# Patient Record
Sex: Male | Born: 1995 | Smoking: Never smoker
Health system: Southern US, Community
[De-identification: ages and names within clinical notes are randomized; demographics above are authoritative.]

---

## 2020-07-18 ENCOUNTER — Emergency Department (HOSPITAL_COMMUNITY): Payer: Self-pay

## 2020-07-18 ENCOUNTER — Other Ambulatory Visit: Payer: Self-pay

## 2020-07-18 ENCOUNTER — Inpatient Hospital Stay (HOSPITAL_COMMUNITY): Payer: Self-pay

## 2020-07-18 ENCOUNTER — Inpatient Hospital Stay (HOSPITAL_COMMUNITY)
Admission: EM | Admit: 2020-07-18 | Discharge: 2020-07-21 | DRG: 964 | Disposition: A | Discharge status: Home / Self Care | Payer: Self-pay | Attending: General Surgery | Admitting: General Surgery

## 2020-07-18 DIAGNOSIS — Q6689 Other  specified congenital deformities of feet: Secondary | ICD-10-CM

## 2020-07-18 DIAGNOSIS — S41031A Puncture wound without foreign body of right shoulder, initial encounter: Principal | ICD-10-CM | POA: Diagnosis present

## 2020-07-18 DIAGNOSIS — W3400XA Accidental discharge from unspecified firearms or gun, initial encounter: Secondary | ICD-10-CM

## 2020-07-18 DIAGNOSIS — Z6841 Body Mass Index (BMI) 40.0 and over, adult: Secondary | ICD-10-CM

## 2020-07-18 DIAGNOSIS — S42141A Displaced fracture of glenoid cavity of scapula, right shoulder, initial encounter for closed fracture: Secondary | ICD-10-CM | POA: Diagnosis present

## 2020-07-18 DIAGNOSIS — S143XXA Injury of brachial plexus, initial encounter: Secondary | ICD-10-CM | POA: Diagnosis present

## 2020-07-18 DIAGNOSIS — T1490XA Injury, unspecified, initial encounter: Secondary | ICD-10-CM

## 2020-07-18 DIAGNOSIS — S27819A Unspecified injury of esophagus (thoracic part), initial encounter: Secondary | ICD-10-CM | POA: Diagnosis present

## 2020-07-18 DIAGNOSIS — S12290A Other displaced fracture of third cervical vertebra, initial encounter for closed fracture: Secondary | ICD-10-CM

## 2020-07-18 DIAGNOSIS — T17908S Unspecified foreign body in respiratory tract, part unspecified causing other injury, sequela: Secondary | ICD-10-CM

## 2020-07-18 DIAGNOSIS — S42101A Fracture of unspecified part of scapula, right shoulder, initial encounter for closed fracture: Secondary | ICD-10-CM | POA: Diagnosis present

## 2020-07-18 DIAGNOSIS — Z0389 Encounter for observation for other suspected diseases and conditions ruled out: Secondary | ICD-10-CM

## 2020-07-18 DIAGNOSIS — Z20822 Contact with and (suspected) exposure to covid-19: Secondary | ICD-10-CM | POA: Diagnosis present

## 2020-07-18 DIAGNOSIS — G8911 Acute pain due to trauma: Secondary | ICD-10-CM | POA: Diagnosis present

## 2020-07-18 LAB — URINALYSIS, ROUTINE W REFLEX MICROSCOPIC
Bilirubin Urine: NEGATIVE
Glucose, UA: NEGATIVE mg/dL
Hgb urine dipstick: NEGATIVE
Ketones, ur: NEGATIVE mg/dL
Leukocytes,Ua: NEGATIVE
Nitrite: NEGATIVE
Protein, ur: NEGATIVE mg/dL
Specific Gravity, Urine: >1.046 — ABNORMAL HIGH (ref 1.005–1.030)
pH: 6 (ref 5.0–8.0)

## 2020-07-18 LAB — I-STAT CHEM 8, ED
BUN: 10 mg/dL (ref 6–20)
Calcium, Ion: 1.06 mmol/L — ABNORMAL LOW (ref 1.15–1.40)
Chloride: 104 mmol/L (ref 98–111)
Creatinine, Ser: 0.7 mg/dL (ref 0.61–1.24)
Glucose, Bld: 135 mg/dL — ABNORMAL HIGH (ref 70–99)
HCT: 44 % (ref 39.0–52.0)
Hemoglobin: 15 g/dL (ref 13.0–17.0)
Potassium: 3.2 mmol/L — ABNORMAL LOW (ref 3.5–5.1)
Sodium: 142 mmol/L (ref 135–145)
TCO2: 21 mmol/L — ABNORMAL LOW (ref 22–32)

## 2020-07-18 LAB — RESPIRATORY PANEL BY RT PCR (FLU A&B, COVID)
Influenza A by PCR: NEGATIVE
Influenza B by PCR: NEGATIVE
SARS Coronavirus 2 by RT PCR: NEGATIVE

## 2020-07-18 LAB — COMPREHENSIVE METABOLIC PANEL
ALT: 22 U/L (ref 0–44)
AST: 22 U/L (ref 15–41)
Albumin: 3.9 g/dL (ref 3.5–5.0)
Alkaline Phosphatase: 73 U/L (ref 38–126)
Anion gap: 12 (ref 5–15)
BUN: 8 mg/dL (ref 6–20)
CO2: 23 mmol/L (ref 22–32)
Calcium: 9.2 mg/dL (ref 8.9–10.3)
Chloride: 103 mmol/L (ref 98–111)
Creatinine, Ser: 0.86 mg/dL (ref 0.61–1.24)
GFR, Estimated: >60 mL/min (ref >60)
Glucose, Bld: 137 mg/dL — ABNORMAL HIGH (ref 70–99)
Potassium: 3.3 mmol/L — ABNORMAL LOW (ref 3.5–5.1)
Sodium: 138 mmol/L (ref 135–145)
Total Bilirubin: 1 mg/dL (ref 0.3–1.2)
Total Protein: 7.2 g/dL (ref 6.5–8.1)

## 2020-07-18 LAB — SAMPLE TO BLOOD BANK

## 2020-07-18 LAB — CBC
HCT: 44.4 % (ref 39.0–52.0)
Hemoglobin: 14.2 g/dL (ref 13.0–17.0)
MCH: 27.8 pg (ref 26.0–34.0)
MCHC: 32 g/dL (ref 30.0–36.0)
MCV: 87.1 fL (ref 80.0–100.0)
Platelets: 295 10*3/uL (ref 150–400)
RBC: 5.1 MIL/uL (ref 4.22–5.81)
RDW: 13.2 % (ref 11.5–15.5)
WBC: 15.9 10*3/uL — ABNORMAL HIGH (ref 4.0–10.5)
nRBC: 0 % (ref 0.0–0.2)

## 2020-07-18 LAB — PROTIME-INR
INR: 1.3 — ABNORMAL HIGH (ref 0.8–1.2)
Prothrombin Time: 15.3 seconds — ABNORMAL HIGH (ref 11.4–15.2)

## 2020-07-18 LAB — LACTIC ACID, PLASMA: Lactic Acid, Venous: 2.4 mmol/L (ref 0.5–1.9)

## 2020-07-18 MED ORDER — IOHEXOL 300 MG/ML  SOLN
75.0000 mL | Freq: Once | INTRAMUSCULAR | Status: AC | PRN
  Administered 2020-07-18: 75 mL via INTRAVENOUS

## 2020-07-18 MED ORDER — SODIUM CHLORIDE 0.9 % IV BOLUS
1000.0000 mL | Freq: Once | INTRAVENOUS | Status: AC
  Administered 2020-07-18: 1000 mL via INTRAVENOUS

## 2020-07-18 MED ORDER — FENTANYL CITRATE (PF) 100 MCG/2ML IJ SOLN
INTRAMUSCULAR | Status: AC
  Administered 2020-07-18: 50 ug
  Filled 2020-07-18: qty 2

## 2020-07-18 MED ORDER — MORPHINE SULFATE (PF) 4 MG/ML IV SOLN
4.0000 mg | Freq: Once | INTRAVENOUS | Status: AC
  Administered 2020-07-18: 4 mg via INTRAVENOUS
  Filled 2020-07-18: qty 1
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
## 2020-07-18 NOTE — ED Notes (Signed)
Admitting Provider at bedside. 

## 2020-07-18 NOTE — ED Notes (Signed)
Chaplain called per pt request, at bedside
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
## 2020-07-18 NOTE — ED Notes (Signed)
Patient transported to CT 

## 2020-07-18 NOTE — Progress Notes (Signed)
Orthopedic Tech Progress Note Patient Details:  Benjamin Jones 25-Feb-1996 [id number]  Ortho Devices Type of Ortho Device: Arm sling Ortho Device/Splint Location: rue Ortho Device/Splint Interventions: Ordered, Application, Adjustment   Post Interventions Patient Tolerated: Well Instructions Provided: Care of device, Adjustment of device   [name] Post 07/18/2020, 11:43 PM
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
## 2020-07-19 ENCOUNTER — Encounter (HOSPITAL_COMMUNITY): Payer: Self-pay

## 2020-07-19 ENCOUNTER — Inpatient Hospital Stay (HOSPITAL_COMMUNITY): Payer: Self-pay

## 2020-07-19 LAB — BASIC METABOLIC PANEL
Anion gap: 10 (ref 5–15)
BUN: 6 mg/dL (ref 6–20)
CO2: 25 mmol/L (ref 22–32)
Calcium: 9.1 mg/dL (ref 8.9–10.3)
Chloride: 104 mmol/L (ref 98–111)
Creatinine, Ser: 0.72 mg/dL (ref 0.61–1.24)
GFR, Estimated: >60 mL/min (ref >60)
Glucose, Bld: 106 mg/dL — ABNORMAL HIGH (ref 70–99)
Potassium: 3.6 mmol/L (ref 3.5–5.1)
Sodium: 139 mmol/L (ref 135–145)

## 2020-07-19 LAB — CBC
HCT: 43.9 % (ref 39.0–52.0)
Hemoglobin: 14.2 g/dL (ref 13.0–17.0)
MCH: 28 pg (ref 26.0–34.0)
MCHC: 32.3 g/dL (ref 30.0–36.0)
MCV: 86.4 fL (ref 80.0–100.0)
Platelets: 258 10*3/uL (ref 150–400)
RBC: 5.08 MIL/uL (ref 4.22–5.81)
RDW: 13.2 % (ref 11.5–15.5)
WBC: 16.5 10*3/uL — ABNORMAL HIGH (ref 4.0–10.5)
nRBC: 0 % (ref 0.0–0.2)

## 2020-07-19 LAB — HIV ANTIBODY (ROUTINE TESTING W REFLEX): HIV Screen 4th Generation wRfx: NONREACTIVE

## 2020-07-19 LAB — ETHANOL: Alcohol, Ethyl (B): <10 mg/dL (ref <10)

## 2020-07-19 MED ORDER — MORPHINE SULFATE (PF) 2 MG/ML IV SOLN
2.0000 mg | INTRAVENOUS | Status: DC | PRN
  Administered 2020-07-19 (×3): 4 mg via INTRAVENOUS
  Administered 2020-07-19 – 2020-07-21 (×5): 2 mg via INTRAVENOUS
  Filled 2020-07-19: qty 1
  Filled 2020-07-19 (×2): qty 2
  Filled 2020-07-19 (×3): qty 1
  Filled 2020-07-19: qty 2
  Filled 2020-07-19: qty 1

## 2020-07-19 MED ORDER — ACETAMINOPHEN 325 MG PO TABS: 650.0000 mg | ORAL_TABLET | ORAL | Status: DC | PRN

## 2020-07-19 MED ORDER — OXYCODONE HCL 5 MG PO TABS: 5.0000 mg | ORAL_TABLET | ORAL | Status: DC | PRN

## 2020-07-19 MED ORDER — ONDANSETRON HCL 4 MG/2ML IJ SOLN: 4.0000 mg | Freq: Four times a day (QID) | INTRAMUSCULAR | Status: DC | PRN

## 2020-07-19 MED ORDER — ENOXAPARIN SODIUM 30 MG/0.3ML ~~LOC~~ SOLN: 30.0000 mg | Freq: Two times a day (BID) | SUBCUTANEOUS | Status: DC

## 2020-07-19 MED ORDER — IOHEXOL 300 MG/ML  SOLN
150.0000 mL | Freq: Once | INTRAMUSCULAR | Status: AC | PRN
  Administered 2020-07-19: 100 mL via ORAL

## 2020-07-19 MED ORDER — METOPROLOL TARTRATE 5 MG/5ML IV SOLN: 5.0000 mg | Freq: Four times a day (QID) | INTRAVENOUS | Status: DC | PRN

## 2020-07-19 MED ORDER — SODIUM CHLORIDE 0.9 % IV SOLN
3.0000 g | Freq: Four times a day (QID) | INTRAVENOUS | Status: DC
  Administered 2020-07-19 – 2020-07-21 (×8): 3 g via INTRAVENOUS
  Filled 2020-07-19 (×4): qty 8
  Filled 2020-07-19: qty 3
  Filled 2020-07-19 (×4): qty 8
  Filled 2020-07-19: qty 3

## 2020-07-19 MED ORDER — ONDANSETRON 4 MG PO TBDP: 4.0000 mg | ORAL_TABLET | Freq: Four times a day (QID) | ORAL | Status: DC | PRN

## 2020-07-19 MED ORDER — ENOXAPARIN SODIUM 30 MG/0.3ML ~~LOC~~ SOLN
30.0000 mg | Freq: Two times a day (BID) | SUBCUTANEOUS | Status: DC
  Administered 2020-07-19 – 2020-07-21 (×5): 30 mg via SUBCUTANEOUS
  Filled 2020-07-19 (×5): qty 0.3

## 2020-07-19 MED ORDER — SODIUM CHLORIDE 0.9 % IV SOLN: INTRAVENOUS | Status: DC

## 2020-07-19 MED ORDER — OXYCODONE HCL 5 MG PO TABS
10.0000 mg | ORAL_TABLET | ORAL | Status: DC | PRN
  Administered 2020-07-19 – 2020-07-20 (×7): 10 mg via ORAL
  Filled 2020-07-19 (×7): qty 2
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
## 2020-07-19 NOTE — Progress Notes (Signed)
Pt's mother would live to be notified 2 hours before pt is being discharged so she an work out a ride for him to be picked up.

## 2020-07-19 NOTE — Progress Notes (Signed)
Orthopedic Tech Progress Note Patient Details:  Zacherie Honeyman Dec 09, 1995 [id number] Confirmed with RN that pt already has aspen cervical collar and shoulder sling applied. Patient ID: Benjamin Jones, male   DOB: Feb 10, 1996, 24 y.o.   MRN: [id number]   Gerald Stabs 07/19/2020, 4:53 PM

## 2020-07-20 ENCOUNTER — Encounter (HOSPITAL_COMMUNITY): Payer: Self-pay

## 2020-07-20 ENCOUNTER — Inpatient Hospital Stay (HOSPITAL_COMMUNITY): Payer: Self-pay | Admitting: Certified Registered"

## 2020-07-20 ENCOUNTER — Inpatient Hospital Stay (HOSPITAL_COMMUNITY): Payer: Self-pay

## 2020-07-20 ENCOUNTER — Encounter (HOSPITAL_COMMUNITY): Admission: EM | Disposition: A | Discharge status: Home / Self Care | Payer: Self-pay | Source: Home / Self Care

## 2020-07-20 HISTORY — PX: ESOPHAGOGASTRODUODENOSCOPY (EGD) WITH PROPOFOL: SHX5813

## 2020-07-20 LAB — CBC
HCT: 41 % (ref 39.0–52.0)
Hemoglobin: 13.3 g/dL (ref 13.0–17.0)
MCH: 27.9 pg (ref 26.0–34.0)
MCHC: 32.4 g/dL (ref 30.0–36.0)
MCV: 86.1 fL (ref 80.0–100.0)
Platelets: 239 10*3/uL (ref 150–400)
RBC: 4.76 MIL/uL (ref 4.22–5.81)
RDW: 13.2 % (ref 11.5–15.5)
WBC: 10.9 10*3/uL — ABNORMAL HIGH (ref 4.0–10.5)
nRBC: 0 % (ref 0.0–0.2)

## 2020-07-20 SURGERY — ESOPHAGOGASTRODUODENOSCOPY (EGD) WITH PROPOFOL
Anesthesia: Monitor Anesthesia Care

## 2020-07-20 MED ORDER — LACTATED RINGERS IV SOLN: INTRAVENOUS | Status: DC

## 2020-07-20 MED ORDER — PROPOFOL 500 MG/50ML IV EMUL
INTRAVENOUS | Status: DC | PRN
  Administered 2020-07-20: 150 ug/kg/min via INTRAVENOUS

## 2020-07-20 MED ORDER — LIDOCAINE 2% (20 MG/ML) 5 ML SYRINGE
INTRAMUSCULAR | Status: DC | PRN
  Administered 2020-07-20: 60 mg via INTRAVENOUS

## 2020-07-20 MED ORDER — LIDOCAINE 2% (20 MG/ML) 5 ML SYRINGE: INTRAMUSCULAR | Status: DC | PRN
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
## 2020-07-20 NOTE — TOC CAGE-AID Note (Signed)
Transition of Care [facility]) - CAGE-AID Screening   Patient Details  Name: Benjamin Jones MRN: [id number] Date of Birth: [birth year]  Transition of Care [facility]) CM/SW Contact:    Emeterio Reeve, [facility] Phone Number: 07/20/2020, 12:57 PM   Clinical Narrative:  CSW met with pt at bedside. CSW introduced self and explained role at the hospital.  Pt denies alcohol use. Pt occasional marijuana use. Pt reports he does not need resources at this time.    CAGE-AID Screening:    Have You Ever Felt You Ought to Cut Down on Your Drinking or Drug Use?: No Have People Annoyed You By Critizing Your Drinking Or Drug Use?: No Have You Felt Bad Or Guilty About Your Drinking Or Drug Use?: No Have You Ever Had a Drink or Used Drugs First Thing In The Morning to Steady Your Nerves or to Get Rid of a Hangover?: No CAGE-AID Score: 0  Substance Abuse Education Offered: Yes    Benjamin Jones, [place] Social Worker [phone]

## 2020-07-20 NOTE — Plan of Care (Signed)
°  Problem: Education: °Goal: Knowledge of General Education information will improve °Description: Including pain rating scale, medication(s)/side effects and non-pharmacologic comfort measures °Outcome: Progressing °  °Problem: Health Behavior/Discharge Planning: °Goal: Ability to manage health-related needs will improve °Outcome: Progressing °  °Problem: Clinical Measurements: °Goal: Will remain free from infection °Outcome: Progressing °  °Problem: Activity: °Goal: Risk for activity intolerance will decrease °Outcome: Progressing °  °Problem: Nutrition: °Goal: Adequate nutrition will be maintained °Outcome: Progressing °  °Problem: Coping: °Goal: Level of anxiety will decrease °Outcome: Progressing °  °

## 2020-07-20 NOTE — Anesthesia Postprocedure Evaluation (Signed)
Anesthesia Post Note  Patient: Benjamin Jones  Procedure(s) Performed: ESOPHAGOGASTRODUODENOSCOPY (EGD) WITH PROPOFOL (N/A )     Patient location during evaluation: PACU Anesthesia Type: MAC Level of consciousness: awake and alert Pain management: pain level controlled Vital Signs Assessment: post-procedure vital signs reviewed and stable Respiratory status: spontaneous breathing and respiratory function stable Cardiovascular status: stable Postop Assessment: no apparent nausea or vomiting Anesthetic complications: no   No complications documented.  Last Vitals:  Vitals:   07/20/20 1445 07/20/20 1455  BP: (!) 144/99 (!) 159/76  Pulse: 79 73  Resp: 16 15  Temp:    SpO2: 100% 98%    Last Pain:  Vitals:   07/20/20 1455  TempSrc:   PainSc: 8                  Candra R Izayah Miner
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
## 2020-07-20 NOTE — Anesthesia Preprocedure Evaluation (Addendum)
Anesthesia Evaluation  Patient identified by MRN, date of birth, ID band Patient awake    Reviewed: Allergy & Precautions, NPO status , Patient's Chart, lab work & pertinent test results  History of Anesthesia Complications Negative for: history of anesthetic complications  Airway Mallampati: II  TM Distance: >3 FB Neck ROM: Full    Dental no notable dental hx.    Pulmonary neg pulmonary ROS,    Pulmonary exam normal breath sounds clear to auscultation       Cardiovascular Exercise Tolerance: Good negative cardio ROS Normal cardiovascular exam Rhythm:Regular Rate:Normal     Neuro/Psych negative neurological ROS  negative psych ROS   GI/Hepatic negative GI ROS, Neg liver ROS,   Endo/Other  Morbid obesity  Renal/GU negative Renal ROS  negative genitourinary   Musculoskeletal negative musculoskeletal ROS (+)   Abdominal (+) + obese,   Peds negative pediatric ROS (+)  Hematology negative hematology ROS (+)   Anesthesia Other Findings   Reproductive/Obstetrics negative OB ROS                            Anesthesia Physical Anesthesia Plan  ASA: II  Anesthesia Plan: MAC   Post-op Pain Management:    Induction:   PONV Risk Score and Plan: 1 and Propofol infusion and TIVA  Airway Management Planned: Simple Face Mask, Nasal Cannula and Natural Airway  Additional Equipment:   Intra-op Plan:   Post-operative Plan:   Informed Consent: I have reviewed the patients History and Physical, chart, labs and discussed the procedure including the risks, benefits and alternatives for the proposed anesthesia with the patient or authorized representative who has indicated his/her understanding and acceptance.       Plan Discussed with: CRNA and Anesthesiologist  Anesthesia Plan Comments:         Anesthesia Quick Evaluation

## 2020-07-20 NOTE — Transfer of Care (Signed)
Immediate Anesthesia Transfer of Care Note  Patient: Benjamin Jones  Procedure(s) Performed: ESOPHAGOGASTRODUODENOSCOPY (EGD) WITH PROPOFOL (N/A )  Patient Location: Endoscopy Unit  Anesthesia Type:MAC  Level of Consciousness: awake, alert  and oriented  Airway & Oxygen Therapy: Patient Spontanous Breathing and Patient connected to nasal cannula oxygen  Post-op Assessment: Report given to RN and Post -op Vital signs reviewed and stable  Post vital signs: Reviewed and stable  Last Vitals:  Vitals Value Taken Time  BP 129/52 07/20/20 1435  Temp    Pulse 93 07/20/20 1436  Resp 22 07/20/20 1436  SpO2 100 % 07/20/20 1436  Vitals shown include unvalidated device data.  Last Pain:  Vitals:   07/20/20 1354  TempSrc: Temporal  PainSc: 8       Patients Stated Pain Goal: 2 (62/83/66 2947)  Complications: No complications documented.
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
## 2020-07-20 NOTE — Plan of Care (Signed)
  Problem: Education: Goal: Knowledge of General Education information will improve Description: Including pain rating scale, medication(s)/side effects and non-pharmacologic comfort measures Outcome: Progressing   Problem: Health Behavior/Discharge Planning: Goal: Ability to manage health-related needs will improve Outcome: Progressing   Problem: Clinical Measurements: Goal: Will remain free from infection Outcome: Progressing   
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
## 2020-07-21 ENCOUNTER — Other Ambulatory Visit (HOSPITAL_COMMUNITY): Payer: Self-pay | Admitting: General Surgery

## 2020-07-21 ENCOUNTER — Encounter (HOSPITAL_COMMUNITY): Payer: Self-pay | Admitting: Surgery

## 2020-07-21 ENCOUNTER — Inpatient Hospital Stay (HOSPITAL_COMMUNITY): Payer: Self-pay

## 2020-07-21 LAB — CBC
HCT: 39.2 % (ref 39.0–52.0)
Hemoglobin: 13.1 g/dL (ref 13.0–17.0)
MCH: 28.8 pg (ref 26.0–34.0)
MCHC: 33.4 g/dL (ref 30.0–36.0)
MCV: 86.2 fL (ref 80.0–100.0)
Platelets: 204 10*3/uL (ref 150–400)
RBC: 4.55 MIL/uL (ref 4.22–5.81)
RDW: 13 % (ref 11.5–15.5)
WBC: 11.2 10*3/uL — ABNORMAL HIGH (ref 4.0–10.5)
nRBC: 0 % (ref 0.0–0.2)

## 2020-07-21 MED ORDER — ACETAMINOPHEN 500 MG PO TABS: 1000.0000 mg | ORAL_TABLET | Freq: Four times a day (QID) | ORAL | Status: AC | PRN

## 2020-07-21 MED ORDER — GABAPENTIN 300 MG PO CAPS: 300.0000 mg | ORAL_CAPSULE | Freq: Two times a day (BID) | ORAL | 0 refills | Status: DC | PRN

## 2020-07-21 MED ORDER — OXYCODONE HCL 5 MG PO TABS: 5.0000 mg | ORAL_TABLET | ORAL | 0 refills | Status: DC | PRN

## 2020-07-21 MED ORDER — OXYCODONE HCL 5 MG PO TABS
5.0000 mg | ORAL_TABLET | ORAL | 0 refills | Status: AC | PRN
Start: 2020-07-21 — End: ?

## 2020-07-21 MED ORDER — GABAPENTIN 300 MG PO CAPS: 300.0000 mg | ORAL_CAPSULE | Freq: Two times a day (BID) | ORAL | 0 refills | Status: AC | PRN

## 2020-07-21 MED FILL — oxyCODONE HCL 5 MG TABS: 5 | 3 days supply | Qty: 20 | Fill #0

## 2020-07-21 MED FILL — GABAPENTIN 300 MG CAPSULE: 300 | 30 days supply | Qty: 30 | Fill #0

## 2020-07-21 NOTE — Plan of Care (Signed)
  Problem: Education: Goal: Knowledge of General Education information will improve Description: Including pain rating scale, medication(s)/side effects and non-pharmacologic comfort measures Outcome: Adequate for Discharge   Problem: Health Behavior/Discharge Planning: Goal: Ability to manage health-related needs will improve Outcome: Adequate for Discharge   Problem: Clinical Measurements: Goal: Ability to maintain clinical measurements within normal limits will improve Outcome: Adequate for Discharge Goal: Will remain free from infection Outcome: Adequate for Discharge   Problem: Activity: Goal: Risk for activity intolerance will decrease Outcome: Adequate for Discharge   Problem: Nutrition: Goal: Adequate nutrition will be maintained Outcome: Adequate for Discharge   Problem: Coping: Goal: Level of anxiety will decrease Outcome: Adequate for Discharge   Problem: Elimination: Goal: Will not experience complications related to bowel motility Outcome: Adequate for Discharge   Problem: Pain Managment: Goal: General experience of comfort will improve Outcome: Adequate for Discharge   Problem: Safety: Goal: Ability to remain free from injury will improve Outcome: Adequate for Discharge
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
## 2020-07-21 NOTE — Progress Notes (Signed)
Discharge summary packet provided to pt/mother, with instructions. Pt/mother verbalized understanding of instructions . No complaints voiced. All questions and concerns were answered. D/C to home as ordered, Pt remains alert/oriented in no apparent distress.
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
